# Patient Record
Sex: Female | Born: 1965 | Marital: Single | Smoking: Never smoker
Health system: Southern US, Community
[De-identification: ages and names within clinical notes are randomized; demographics above are authoritative.]

## PROBLEM LIST (undated history)

## (undated) DIAGNOSIS — I10 Essential (primary) hypertension: Secondary | ICD-10-CM

## (undated) DIAGNOSIS — T148XXA Other injury of unspecified body region, initial encounter: Secondary | ICD-10-CM

## (undated) DIAGNOSIS — M1990 Unspecified osteoarthritis, unspecified site: Secondary | ICD-10-CM

## (undated) DIAGNOSIS — L409 Psoriasis, unspecified: Secondary | ICD-10-CM

## (undated) HISTORY — DX: Psoriasis, unspecified: L40.9

## (undated) HISTORY — DX: Unspecified osteoarthritis, unspecified site: M19.90

## (undated) HISTORY — DX: Essential (primary) hypertension: I10

## (undated) HISTORY — DX: Other injury of unspecified body region, initial encounter: T14.8XXA

---

## 2018-12-05 ENCOUNTER — Encounter (INDEPENDENT_AMBULATORY_CARE_PROVIDER_SITE_OTHER): Payer: Self-pay | Admitting: Internal Medicine

## 2018-12-05 ENCOUNTER — Ambulatory Visit (INDEPENDENT_AMBULATORY_CARE_PROVIDER_SITE_OTHER): Payer: 59 | Admitting: Internal Medicine

## 2018-12-05 VITALS — BP 146/88 | HR 76 | Temp 98.2°F | Resp 14 | Ht 65.5 in | Wt 144.4 lb

## 2018-12-05 DIAGNOSIS — Z0000 Encounter for general adult medical examination without abnormal findings: Secondary | ICD-10-CM

## 2018-12-05 DIAGNOSIS — R5383 Other fatigue: Secondary | ICD-10-CM

## 2018-12-05 DIAGNOSIS — Z1239 Encounter for other screening for malignant neoplasm of breast: Secondary | ICD-10-CM

## 2018-12-05 DIAGNOSIS — Z13220 Encounter for screening for lipoid disorders: Secondary | ICD-10-CM

## 2018-12-05 DIAGNOSIS — E559 Vitamin D deficiency, unspecified: Secondary | ICD-10-CM

## 2018-12-05 MED ORDER — PRAMIPEXOLE DIHYDROCHLORIDE 0.75 MG PO TABS
0.7500 mg | ORAL_TABLET | Freq: Two times a day (BID) | ORAL | 3 refills | Status: DC
Start: 2018-12-05 — End: 2019-11-05

## 2018-12-05 NOTE — Progress Notes (Signed)
[facility]  PROGRESS NOTE    Date of Encounter:  12/05/2018 5:38 PM  Patient Name: Molly Trujillo,Molly Trujillo,53 y.o.,09/17/1965        Subjective:     Chief Complaint   Patient presents with    Establish Care       HPI: Patient here to establish care.  Overall very healthy lady.  Good weight.  Diet as well and exercises daily.  Due for fasting labs.  Due for mammogram.  Mother with breast cancer in her 54s.  Never had colon cancer screening.  No family history of colon cancer.  Had a normal Pap smear around 2018.  Has not quite been 2 years.  She takes Mirapex 2 times daily.  She has right leg pains for which this helps.  She has a history of a surgery for torn gluteus maximus muscle.  Had some nerve damage with this for which the Mirapex helps.  Denies any chest pain shortness of breath or palpitations.  Normal bowel movements for her.  No headache or blurry vision.    I have reviewed the patient's medical history in detail; there are no changes to the history as noted in the electronic medical record.    Past Medical History:   Diagnosis Date    Hypertension     Nerve damage     gluteus maximus    Psoriasis      Past Surgical History:   Procedure Laterality Date    CARPAL TUNNEL RELEASE      gluteus maximus      HIP SURGERY Right     x3     History reviewed. No pertinent family history.  Social History     Socioeconomic History    Marital status: Single     Spouse name: Not on file    Number of children: Not on file    Years of education: Not on file    Highest education level: Not on file   Occupational History    Not on file   Social Needs    Financial resource strain: Not on file    Food insecurity     Worry: Not on file     Inability: Not on file    Transportation needs     Medical: Not on file     Non-medical: Not on file   Tobacco Use    Smoking status: Never Smoker    Smokeless tobacco: Never Used   Substance and Sexual Activity    Alcohol use: Yes     Comment: occ    Drug  use: Not Currently    Sexual activity: Not on file   Lifestyle    Physical activity     Days per week: Not on file     Minutes per session: Not on file    Stress: Not on file   Relationships    Social connections     Talks on phone: Not on file     Gets together: Not on file     Attends religious service: Not on file     Active member of club or organization: Not on file     Attends meetings of clubs or organizations: Not on file     Relationship status: Not on file    Intimate partner violence     Fear of current or ex partner: Not on file     Emotionally abused: Not on file     Physically abused: Not on file  Forced sexual activity: Not on file   Other Topics Concern    Not on file   Social History Narrative    Not on file     Allergies   Allergen Reactions    Penicillins     Sulfa Antibiotics      Medications:     Outpatient Medications Marked as Taking for the 12/05/18 encounter (Office Visit) with Remonia Richter, MD   Medication Sig Dispense Refill    pramipexole (MIRAPEX) 0.75 MG tablet Take 1 tablet (0.75 mg total) by mouth 2 (two) times daily 180 tablet 3    [DISCONTINUED] pramipexole (MIRAPEX) 0.75 MG tablet Take 0.75 mg by mouth 3 (three) times daily       Review of Systems:   Review of Systems   Constitutional: Negative for chills, fever and weight loss.   HENT: Negative for congestion, nosebleeds and sinus pain.    Eyes: Negative for blurred vision, double vision and discharge.   Respiratory: Negative for cough, hemoptysis, sputum production, shortness of breath and wheezing.    Cardiovascular: Negative for chest pain, palpitations and leg swelling.   Gastrointestinal: Negative for abdominal pain, constipation, diarrhea, nausea and vomiting.   Genitourinary: Negative for dysuria, frequency and hematuria.   Musculoskeletal: Negative for back pain, falls and joint pain.   Skin: Negative for rash.   Neurological: Negative for dizziness, tremors, seizures, loss of consciousness and  headaches.   Endo/Heme/Allergies: Does not bruise/bleed easily.   Psychiatric/Behavioral: Negative for depression and suicidal ideas.     Physical Exam:     Vitals:    12/05/18 1657   BP: 146/88   Pulse: 76   Resp: 14   Temp: 98.2 F (36.8 C)     Body mass index is 23.66 kg/m.    Physical Exam   Constitutional: She is oriented to person, place, and time and well-developed, well-nourished, and in no distress. No distress.   HENT:   Head: Normocephalic and atraumatic.   Right Ear: External ear normal.   Left Ear: External ear normal.   Nose: Nose normal.   Eyes: Conjunctivae are normal.   Neck: Normal range of motion. Neck supple. No thyromegaly present.   Cardiovascular: Normal rate, regular rhythm and normal heart sounds.   No murmur heard.  Pulmonary/Chest: Effort normal and breath sounds normal. No respiratory distress. She has no wheezes. She has no rales.   Abdominal: Soft. Bowel sounds are normal.   Musculoskeletal: Normal range of motion.         General: No edema.   Neurological: She is alert and oriented to person, place, and time.   Skin: Skin is warm and dry. No rash noted. She is not diaphoretic.   Psychiatric: Mood, memory, affect and judgment normal.   Nursing note and vitals reviewed.    Assessment:   Emmersyn was seen today for establish care.    Diagnoses and all orders for this visit:    Breast cancer screening  -     Mammo Digital Screening Bilateral W Cad; Future    Fatigue, unspecified type  -     CBC and differential; Future  -     TSH, Abn Reflex to Free T4, Serum; Future    Vitamin D deficiency  -     Vitamin D,25 OH, Total; Future    Screening, lipid  -     Comprehensive metabolic panel; Future  -     Lipid panel; Future    Well adult on  routine health check    Other orders  -     pramipexole (MIRAPEX) 0.75 MG tablet; Take 1 tablet (0.75 mg total) by mouth 2 (two) times daily      Plan:     History of gluteus maximus surgery for torn muscle on the right side  -Nerve damage for which Mirapex  0.75 mg twice daily helps; continue    HCM  - does not smoke; social alcohol  - mammo due; referral given today; mother w/ breast CA in 40's  -set up for cologuard today; medicaid cover?  - pap always nl;  Last being 2018  - set up for fasting labs soon  -Need to address vaccines      Return to clinic in 1 year or sooner as needed............. fasting labs soon    Dr. Jasper Loser, MD  [facility]           Portions of this note may be dictated using voice recognition software. Variances in spelling and vocabulary are possible and unintentional. Not all errors are caught/corrected. Please notify the There[facility] Parkin if any discrepancies are noted or if the meaning of any statement is not clear.

## 2018-12-07 ENCOUNTER — Telehealth (INDEPENDENT_AMBULATORY_CARE_PROVIDER_SITE_OTHER): Payer: Self-pay | Admitting: Internal Medicine

## 2018-12-07 ENCOUNTER — Ambulatory Visit (INDEPENDENT_AMBULATORY_CARE_PROVIDER_SITE_OTHER): Payer: 59

## 2018-12-07 ENCOUNTER — Other Ambulatory Visit
Admission: RE | Admit: 2018-12-07 | Discharge: 2018-12-07 | Disposition: A | Discharge status: Home / Self Care | Payer: 59 | Source: Ambulatory Visit | Attending: Internal Medicine | Admitting: Internal Medicine

## 2018-12-07 DIAGNOSIS — Z13220 Encounter for screening for lipoid disorders: Secondary | ICD-10-CM

## 2018-12-07 DIAGNOSIS — E559 Vitamin D deficiency, unspecified: Secondary | ICD-10-CM

## 2018-12-07 DIAGNOSIS — R5383 Other fatigue: Secondary | ICD-10-CM

## 2018-12-07 DIAGNOSIS — M545 Low back pain, unspecified: Secondary | ICD-10-CM

## 2018-12-07 DIAGNOSIS — Z0000 Encounter for general adult medical examination without abnormal findings: Secondary | ICD-10-CM

## 2018-12-07 LAB — CBC AND DIFFERENTIAL
Basophils %: 1.3 % (ref 0.0–3.0)
Basophils Absolute: 0.1 10*3/uL (ref 0.0–0.3)
Eosinophils %: 0.9 % (ref 0.0–7.0)
Eosinophils Absolute: 0.1 10*3/uL (ref 0.0–0.8)
Hematocrit: 42.9 % (ref 36.0–48.0)
Hemoglobin: 14.5 gm/dL (ref 12.0–16.0)
Lymphocytes Absolute: 2 10*3/uL (ref 0.6–5.1)
Lymphocytes: 33.2 % (ref 15.0–46.0)
MCH: 33 pg (ref 28–35)
MCHC: 34 gm/dL (ref 32–36)
MCV: 96 fL (ref 80–100)
MPV: 7.4 fL (ref 6.0–10.0)
Monocytes Absolute: 0.4 10*3/uL (ref 0.1–1.7)
Monocytes: 6.6 % (ref 3.0–15.0)
Neutrophils %: 57.9 % (ref 42.0–78.0)
Neutrophils Absolute: 3.5 10*3/uL (ref 1.7–8.6)
PLT CT: 292 10*3/uL (ref 130–440)
RBC: 4.46 10*6/uL (ref 3.80–5.00)
RDW: 12.1 % (ref 11.0–14.0)
WBC: 6.1 10*3/uL (ref 4.0–11.0)

## 2018-12-07 LAB — LIPID PANEL
Cholesterol: 266 mg/dL — ABNORMAL HIGH (ref 75–199)
Coronary Heart Disease Risk: 2.71
HDL: 98 mg/dL — ABNORMAL HIGH (ref 45–65)
LDL Calculated: 147 mg/dL
Triglycerides: 103 mg/dL (ref 10–150)
VLDL: 21 (ref 0–40)

## 2018-12-07 LAB — COMPREHENSIVE METABOLIC PANEL
ALT: 17 U/L (ref 0–55)
AST (SGOT): 18 U/L (ref 10–42)
Albumin/Globulin Ratio: 1.62 Ratio (ref 0.80–2.00)
Albumin: 4.2 gm/dL (ref 3.5–5.0)
Alkaline Phosphatase: 69 U/L (ref 40–145)
Anion Gap: 11.9 mMol/L (ref 7.0–18.0)
BUN / Creatinine Ratio: 7.7 Ratio — ABNORMAL LOW (ref 10.0–30.0)
BUN: 7 mg/dL (ref 7–22)
Bilirubin, Total: 0.6 mg/dL (ref 0.1–1.2)
CO2: 27 mMol/L (ref 20–30)
Calcium: 9.2 mg/dL (ref 8.5–10.5)
Chloride: 104 mMol/L (ref 98–110)
Creatinine: 0.91 mg/dL (ref 0.60–1.20)
EGFR: 73 mL/min/{1.73_m2} (ref 60–150)
Globulin: 2.6 gm/dL (ref 2.0–4.0)
Glucose: 89 mg/dL (ref 71–99)
Osmolality Calculated: 275 mOsm/kg (ref 275–300)
Potassium: 3.9 mMol/L (ref 3.5–5.3)
Protein, Total: 6.8 gm/dL (ref 6.0–8.3)
Sodium: 139 mMol/L (ref 136–147)

## 2018-12-07 LAB — THYROID STIMULATING HORMONE (TSH), REFLEX ON ABNORMAL TO FREE T4, SERUM: TSH: 0.98 u[IU]/mL (ref 0.40–4.20)

## 2018-12-07 NOTE — Progress Notes (Signed)
Date Specimen Drawn:  12/07/2018   Time Specimen Drawn:  10:01 AM   Test(s) Ordered:  Cbc,cmp,lipid,tsh/r,D   Patient's Tolerance:  Good   Location Specimen Drawn:  Left antecubital   Barcode # [id number]   [name]

## 2018-12-07 NOTE — Telephone Encounter (Signed)
Pt is asking I regards to a referral for a physical therapist or chiropractor for her back; I dont see one placed; pls advise     She has had three hip surgeries and she feels like she is out of alignment or something

## 2018-12-08 LAB — VITAMIN D,25 OH,TOTAL: Vitamin D 25-Hydroxy: 43.9 ng/mL (ref 30.0–80.0)

## 2018-12-09 NOTE — Telephone Encounter (Signed)
Pt needs referral; chiropractor does not;  She never asked for this during her appointment;  Which one does she want

## 2018-12-10 NOTE — Addendum Note (Signed)
Addended by: Rozanna Box on: 12/10/2018 12:31 PM     Modules accepted: Orders

## 2018-12-10 NOTE — Telephone Encounter (Signed)
Left message for patient to call office.  

## 2018-12-10 NOTE — Telephone Encounter (Signed)
Physical therapist

## 2018-12-10 NOTE — Progress Notes (Signed)
Pt advised.

## 2018-12-28 ENCOUNTER — Encounter (INDEPENDENT_AMBULATORY_CARE_PROVIDER_SITE_OTHER): Payer: Self-pay | Admitting: Internal Medicine

## 2018-12-28 NOTE — Telephone Encounter (Signed)
From patient.

## 2019-01-08 ENCOUNTER — Encounter (INDEPENDENT_AMBULATORY_CARE_PROVIDER_SITE_OTHER): Payer: Self-pay | Admitting: Internal Medicine

## 2019-02-11 ENCOUNTER — Encounter (INDEPENDENT_AMBULATORY_CARE_PROVIDER_SITE_OTHER): Payer: Self-pay | Admitting: Internal Medicine

## 2019-02-11 NOTE — Telephone Encounter (Signed)
From pt

## 2019-02-12 ENCOUNTER — Telehealth (INDEPENDENT_AMBULATORY_CARE_PROVIDER_SITE_OTHER): Payer: Self-pay

## 2019-02-12 DIAGNOSIS — M545 Low back pain, unspecified: Secondary | ICD-10-CM

## 2019-02-12 DIAGNOSIS — M25551 Pain in right hip: Secondary | ICD-10-CM

## 2019-02-12 NOTE — Telephone Encounter (Signed)
Printed up front

## 2019-02-12 NOTE — Telephone Encounter (Signed)
From pt

## 2019-02-12 NOTE — Telephone Encounter (Signed)
Xray orders pended.

## 2019-02-19 ENCOUNTER — Telehealth (INDEPENDENT_AMBULATORY_CARE_PROVIDER_SITE_OTHER): Payer: Self-pay

## 2019-02-19 NOTE — Telephone Encounter (Signed)
Called patient and verified date of birth, gave xray results .transferred up front to make appt

## 2019-02-20 ENCOUNTER — Ambulatory Visit (INDEPENDENT_AMBULATORY_CARE_PROVIDER_SITE_OTHER): Payer: 59 | Admitting: Internal Medicine

## 2019-02-20 ENCOUNTER — Encounter (INDEPENDENT_AMBULATORY_CARE_PROVIDER_SITE_OTHER): Payer: Self-pay | Admitting: Internal Medicine

## 2019-02-20 ENCOUNTER — Other Ambulatory Visit
Admission: RE | Admit: 2019-02-20 | Discharge: 2019-02-20 | Disposition: A | Discharge status: Home / Self Care | Payer: 59 | Source: Ambulatory Visit | Attending: Internal Medicine | Admitting: Internal Medicine

## 2019-02-20 VITALS — BP 130/88 | HR 80 | Temp 98.9°F | Resp 16 | Ht 65.5 in | Wt 142.2 lb

## 2019-02-20 DIAGNOSIS — M5416 Radiculopathy, lumbar region: Secondary | ICD-10-CM

## 2019-02-20 DIAGNOSIS — M5441 Lumbago with sciatica, right side: Secondary | ICD-10-CM

## 2019-02-20 DIAGNOSIS — G8929 Other chronic pain: Secondary | ICD-10-CM

## 2019-02-20 LAB — SEDIMENTATION RATE: Sed Rate: 2 mm/hr (ref 0–20)

## 2019-02-20 MED ORDER — DICLOFENAC SODIUM 75 MG PO TBEC
75.00 mg | DELAYED_RELEASE_TABLET | Freq: Two times a day (BID) | ORAL | 0 refills | Status: DC
Start: 2019-02-20 — End: 2019-03-20

## 2019-02-20 NOTE — Progress Notes (Signed)
[facility]  PROGRESS NOTE    Date of Encounter:  02/20/2019 2:36 PM  Patient Name: Molly Trujillo,Molly Trujillo,53 y.o.,13-Aug-1965        Subjective:     Chief Complaint   Patient presents with   . Hip Pain     xrays showed Arthritis   . Back Pain       HPI: Patient here for acute visit.  Roughly 6 months of worsening right lower back pain radiating around her hip down the anterior part of her leg but not below the knee.  Some point tenderness over the right trochanteric bursa.  Mild arthritis on recent right hip x-ray.  Some facet disease at L 5/S1.  Patient with a history of right gluteal maximus tear with surgical repair in the past.  She does yoga couple times weekly.  No associated paresthesias.  No leg weakness.  No new bowel or bladder issues with this.    I have reviewed the patient's medical history in detail; there are no changes to the history as noted in the electronic medical record.    Past Medical History:   Diagnosis Date   . Arthritis    . Hypertension    . Nerve damage     gluteus maximus   . Psoriasis      Past Surgical History:   Procedure Laterality Date   . CARPAL TUNNEL RELEASE     . gluteus maximus     . HIP SURGERY Right     x3     No family history on file.  Social History     Socioeconomic History   . Marital status: Single     Spouse name: Not on file   . Number of children: Not on file   . Years of education: Not on file   . Highest education level: Not on file   Occupational History   . Not on file   Social Needs   . Financial resource strain: Not on file   . Food insecurity     Worry: Not on file     Inability: Not on file   . Transportation needs     Medical: Not on file     Non-medical: Not on file   Tobacco Use   . Smoking status: Never Smoker   . Smokeless tobacco: Never Used   Substance and Sexual Activity   . Alcohol use: Yes     Comment: occ   . Drug use: Not Currently   . Sexual activity: Not on file   Lifestyle   . Physical activity     Days per week: Not on file      Minutes per session: Not on file   . Stress: Not on file   Relationships   . Social Wellsite geologist on phone: Not on file     Gets together: Not on file     Attends religious service: Not on file     Active member of club or organization: Not on file     Attends meetings of clubs or organizations: Not on file     Relationship status: Not on file   . Intimate partner violence     Fear of current or ex partner: Not on file     Emotionally abused: Not on file     Physically abused: Not on file     Forced sexual activity: Not on file   Other Topics Concern   .  Not on file   Social History Narrative   . Not on file     Allergies   Allergen Reactions   . Penicillins    . Sulfa Antibiotics      Medications:     Outpatient Medications Marked as Taking for the 02/20/19 encounter (Office Visit) with Remonia Richter, MD   Medication Sig Dispense Refill   . pramipexole (MIRAPEX) 0.75 MG tablet Take 1 tablet (0.75 mg total) by mouth 2 (two) times daily 180 tablet 3     Review of Systems:   Review of Systems   Constitutional: Negative for chills, fever and weight loss.   HENT: Negative for congestion, nosebleeds and sinus pain.    Eyes: Negative for blurred vision, double vision and discharge.   Respiratory: Negative for cough, hemoptysis, sputum production, shortness of breath and wheezing.    Cardiovascular: Negative for chest pain, palpitations and leg swelling.   Gastrointestinal: Negative for abdominal pain, constipation, diarrhea, nausea and vomiting.   Genitourinary: Negative for dysuria, frequency and hematuria.   Musculoskeletal: Positive for back pain, joint pain and neck pain. Negative for falls.   Skin: Negative for rash.   Neurological: Negative for dizziness, tremors, seizures, loss of consciousness and headaches.   Endo/Heme/Allergies: Does not bruise/bleed easily.   Psychiatric/Behavioral: Negative for depression and suicidal ideas.     Physical Exam:     Vitals:    02/20/19 1408   BP: 130/88   Pulse: 80    Resp: 16   Temp: 98.9 F (37.2 C)   SpO2: 99%     Body mass index is 23.3 kg/m.    Physical Exam   Constitutional: She is oriented to person, place, and time and well-developed, well-nourished, and in no distress. No distress.   HENT:   Head: Normocephalic and atraumatic.   Right Ear: External ear normal.   Left Ear: External ear normal.   Nose: Nose normal.   Eyes: Conjunctivae are normal.   Neck: Normal range of motion. Neck supple. No thyromegaly present.   Cardiovascular: Normal rate, regular rhythm and normal heart sounds.   No murmur heard.  Pulmonary/Chest: Effort normal and breath sounds normal. No respiratory distress. She has no wheezes. She has no rales.   Abdominal: Soft. Bowel sounds are normal.   Musculoskeletal: Normal range of motion.         General: Tenderness present. No edema.      Comments: Straight leg test negative bilaterally.  Slight tenderness to palpation over the right trochanteric bursa   Neurological: She is alert and oriented to person, place, and time.   Skin: Skin is warm and dry. No rash noted. She is not diaphoretic.   Psychiatric: Mood, memory, affect and judgment normal.   Nursing note and vitals reviewed.    Assessment:   Molly Trujillo was seen today for hip pain and back pain.    Diagnoses and all orders for this visit:    Lumbar radiculopathy  -     MRI Lumbar spine without contrast; Future  -     C Reactive Protein; Future  -     Sedimentation rate (ESR); Future  -     ANA, IFA, Reflex Titer & Pattern; Future  -     Rheumatoid factor; Future    Chronic right-sided low back pain with right-sided sciatica    Other orders  -     diclofenac (VOLTAREN) 75 MG EC tablet; Take 1 tablet (75 mg total) by mouth 2 (  two) times daily      Plan:     Chronic right-sided low back pain with some radicular symptoms down her anterior right thigh  -Lumbar x-ray showed L5-S1 facet disease with mild arthritis on her right hip as well  -May have some mild right trochanteric bursa as well  -Home  piriformis exercises given to patient today  -Diclofenac 75 mg twice daily for 1 month  -Failed physical therapy  -Get MRI of her LS spine without IV contrast now    History of gluteus maximus surgery for torn muscle on the right side  -Nerve damage for which Mirapex 0.75 mg twice daily helps; continue    HCM  - does not smoke; social alcohol  - mammo due; referral given; mother w/ breast CA in 40's; due  -set up for cologuard today; medicaid cover?  - pap always nl;  Last being 2018  -Fasting glucose 89, LDL 147, HDL 98, triglycerides 103 04/6107  -Need to address vaccines      Keep routine follow-up.........Marland Kitchen    Dr. Jasper Loser, MD  [facility]           Portions of this note may be dictated using voice recognition software. Variances in spelling and vocabulary are possible and unintentional. Not all errors are caught/corrected. Please notify the [facility] Parkin if any discrepancies are noted or if the meaning of any statement is not clear.

## 2019-02-21 LAB — C-REACTIVE PROTEIN: C-Reactive Protein: 0.02 mg/dL (ref 0.02–0.80)

## 2019-02-21 LAB — RHEUMATOID FACTOR: Rheumatoid Factor: <15 IU/mL (ref 0.0–29.9)

## 2019-02-24 LAB — ANA SCREEN, IFA, WITH REFLEX TO TITER AND PATTERN: ANA Screen, IFA: NEGATIVE

## 2019-02-25 NOTE — Progress Notes (Signed)
Called patient and verified date of birth, gave lab results

## 2019-03-19 ENCOUNTER — Encounter (INDEPENDENT_AMBULATORY_CARE_PROVIDER_SITE_OTHER): Payer: Self-pay | Admitting: Internal Medicine

## 2019-03-19 NOTE — Telephone Encounter (Signed)
From pt

## 2019-03-20 ENCOUNTER — Other Ambulatory Visit (INDEPENDENT_AMBULATORY_CARE_PROVIDER_SITE_OTHER): Payer: Self-pay | Admitting: Internal Medicine

## 2019-03-26 ENCOUNTER — Telehealth (INDEPENDENT_AMBULATORY_CARE_PROVIDER_SITE_OTHER): Payer: Self-pay | Admitting: Internal Medicine

## 2019-03-26 DIAGNOSIS — M5136 Other intervertebral disc degeneration, lumbar region: Secondary | ICD-10-CM

## 2019-03-26 NOTE — Telephone Encounter (Signed)
Arthritis at disc openings at L4-5 and L5-s1 and disc herniation L5-s1;  Pt vs pain management for injections?

## 2019-03-26 NOTE — Telephone Encounter (Signed)
Pt calling in regards to MRI Results

## 2019-03-26 NOTE — Telephone Encounter (Signed)
Pt. Would like to do pain management, has done PT already and did not help

## 2019-03-26 NOTE — Telephone Encounter (Signed)
Lm to call

## 2019-04-09 ENCOUNTER — Ambulatory Visit (INDEPENDENT_AMBULATORY_CARE_PROVIDER_SITE_OTHER): Payer: 59 | Admitting: Medical

## 2019-04-09 ENCOUNTER — Encounter (INDEPENDENT_AMBULATORY_CARE_PROVIDER_SITE_OTHER): Payer: Self-pay | Admitting: Medical

## 2019-04-09 VITALS — BP 138/80 | HR 80 | Temp 98.5°F | Resp 16 | Ht 66.5 in | Wt 149.4 lb

## 2019-04-09 DIAGNOSIS — Z111 Encounter for screening for respiratory tuberculosis: Secondary | ICD-10-CM

## 2019-04-09 DIAGNOSIS — M5136 Other intervertebral disc degeneration, lumbar region: Secondary | ICD-10-CM

## 2019-04-09 DIAGNOSIS — Z0289 Encounter for other administrative examinations: Secondary | ICD-10-CM

## 2019-04-09 NOTE — Progress Notes (Signed)
Subjective:    Patient ID: Molly Trujillo is a 53 y.o. female.    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  She reports that her duties will be desk duty.  She has a history of degenerative disc disease but has no functional limitations at this time.  She has not traveled or lived abroad and has no unintentional weight loss coughing night sweats hemoptysis shortness of breath, chest pain or unexplained rashes or fevers.  No recreational drug use.  No known mood or psychiatric disorders.    Review of Systems   All other systems reviewed and are negative.          Objective:    Physical Exam  Constitutional:       General: She is not in acute distress.     Appearance: She is well-developed.   Eyes:      Pupils: Pupils are equal, round, and reactive to light.   Cardiovascular:      Rate and Rhythm: Normal rate and regular rhythm.      Heart sounds: Normal heart sounds. No murmur. No friction rub. No gallop.    Pulmonary:      Effort: Pulmonary effort is normal. No respiratory distress.      Breath sounds: Normal breath sounds. No wheezing or rales.   Chest:      Chest wall: No tenderness.             Assessment:       1. Lumbar degenerative disc disease    2. Encounter for physical examination related to employment    3. 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  PPD placed today and she will return in 48-72 hours for reading.  Her form was placed in the folder my desk and we will dispense this to her once we have the results of her PPD.

## 2019-04-11 ENCOUNTER — Ambulatory Visit (INDEPENDENT_AMBULATORY_CARE_PROVIDER_SITE_OTHER): Payer: 59

## 2019-04-11 VITALS — Temp 99.1°F

## 2019-04-11 DIAGNOSIS — Z111 Encounter for screening for respiratory tuberculosis: Secondary | ICD-10-CM

## 2019-04-11 LAB — TB SKIN TEST
Induration: 0
TB Skin Test: NEGATIVE mm

## 2019-04-11 NOTE — Progress Notes (Signed)
Patient presents today for PPD read.     0mm induration. Negative read.

## 2019-04-21 ENCOUNTER — Other Ambulatory Visit (INDEPENDENT_AMBULATORY_CARE_PROVIDER_SITE_OTHER): Payer: Self-pay | Admitting: Internal Medicine

## 2019-11-04 ENCOUNTER — Encounter (INDEPENDENT_AMBULATORY_CARE_PROVIDER_SITE_OTHER): Payer: Self-pay | Admitting: Internal Medicine

## 2019-11-05 ENCOUNTER — Other Ambulatory Visit (INDEPENDENT_AMBULATORY_CARE_PROVIDER_SITE_OTHER): Payer: Self-pay

## 2019-11-05 MED ORDER — PRAMIPEXOLE DIHYDROCHLORIDE 0.75 MG PO TABS
0.75 mg | ORAL_TABLET | Freq: Two times a day (BID) | ORAL | 0 refills | Status: AC
Start: 2019-11-05 — End: ?
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
